# Patient Record
Sex: Male | Born: 1985 | Race: White | Hispanic: No
Health system: Southern US, Community
[De-identification: ages and names within clinical notes are randomized; demographics above are authoritative.]

---

## 2007-05-14 ENCOUNTER — Emergency Department (HOSPITAL_COMMUNITY): Admission: EM | Admit: 2007-05-14 | Discharge: 2007-05-14 | Payer: Self-pay | Admitting: Emergency Medicine

## 2007-08-17 ENCOUNTER — Emergency Department: Payer: Self-pay | Admitting: Emergency Medicine

## 2008-01-01 ENCOUNTER — Emergency Department: Payer: Self-pay | Admitting: Emergency Medicine

## 2008-05-10 ENCOUNTER — Emergency Department: Payer: Self-pay | Admitting: Emergency Medicine

## 2008-07-28 ENCOUNTER — Emergency Department: Payer: Self-pay | Admitting: Emergency Medicine

## 2008-11-19 ENCOUNTER — Emergency Department: Payer: Self-pay | Admitting: Emergency Medicine

## 2009-03-28 ENCOUNTER — Emergency Department: Payer: Self-pay | Admitting: Emergency Medicine

## 2009-06-27 ENCOUNTER — Emergency Department: Payer: Self-pay | Admitting: Emergency Medicine

## 2013-03-27 ENCOUNTER — Emergency Department: Payer: Self-pay | Admitting: Emergency Medicine

## 2013-03-27 LAB — URINALYSIS, COMPLETE
Bacteria: NONE SEEN
Bilirubin,UR: NEGATIVE
Blood: NEGATIVE
Glucose,UR: NEGATIVE mg/dL (ref 0–75)
LEUKOCYTE ESTERASE: NEGATIVE
Nitrite: NEGATIVE
Ph: 5 (ref 4.5–8.0)
Protein: 30
SPECIFIC GRAVITY: 1.031 (ref 1.003–1.030)
Squamous Epithelial: NONE SEEN
WBC UR: <1 /HPF (ref 0–5)

## 2013-03-27 LAB — COMPREHENSIVE METABOLIC PANEL
Albumin: 4.2 g/dL (ref 3.4–5.0)
Alkaline Phosphatase: 115 U/L
Anion Gap: 2 — ABNORMAL LOW (ref 7–16)
BUN: 13 mg/dL (ref 7–18)
Bilirubin,Total: 2 mg/dL — ABNORMAL HIGH (ref 0.2–1.0)
CALCIUM: 9.4 mg/dL (ref 8.5–10.1)
CHLORIDE: 97 mmol/L — AB (ref 98–107)
CO2: 31 mmol/L (ref 21–32)
CREATININE: 1.07 mg/dL (ref 0.60–1.30)
EGFR (African American): >60
EGFR (Non-African Amer.): >60
GLUCOSE: 108 mg/dL — AB (ref 65–99)
OSMOLALITY: 261 (ref 275–301)
POTASSIUM: 4 mmol/L (ref 3.5–5.1)
SGOT(AST): 28 U/L (ref 15–37)
SGPT (ALT): 22 U/L (ref 12–78)
Sodium: 130 mmol/L — ABNORMAL LOW (ref 136–145)
Total Protein: 8.5 g/dL — ABNORMAL HIGH (ref 6.4–8.2)

## 2013-03-27 LAB — CBC WITH DIFFERENTIAL/PLATELET
BASOS ABS: 0.1 10*3/uL (ref 0.0–0.1)
Basophil %: 0.5 %
EOS ABS: 0 10*3/uL (ref 0.0–0.7)
EOS PCT: 0 %
HCT: 44.3 % (ref 40.0–52.0)
HGB: 15.4 g/dL (ref 13.0–18.0)
LYMPHS ABS: 0.9 10*3/uL — AB (ref 1.0–3.6)
Lymphocyte %: 6.9 %
MCH: 30.7 pg (ref 26.0–34.0)
MCHC: 34.7 g/dL (ref 32.0–36.0)
MCV: 89 fL (ref 80–100)
MONO ABS: 1 x10 3/mm (ref 0.2–1.0)
MONOS PCT: 7.6 %
Neutrophil #: 10.7 10*3/uL — ABNORMAL HIGH (ref 1.4–6.5)
Neutrophil %: 85 %
PLATELETS: 343 10*3/uL (ref 150–440)
RBC: 4.99 10*6/uL (ref 4.40–5.90)
RDW: 12.1 % (ref 11.5–14.5)
WBC: 12.6 10*3/uL — ABNORMAL HIGH (ref 3.8–10.6)

## 2013-03-27 LAB — LIPASE, BLOOD: LIPASE: 96 U/L (ref 73–393)

## 2013-03-27 LAB — RAPID INFLUENZA A&B ANTIGENS

## 2017-04-02 ENCOUNTER — Emergency Department (HOSPITAL_COMMUNITY): Payer: Self-pay

## 2017-04-02 ENCOUNTER — Encounter (HOSPITAL_COMMUNITY): Payer: Self-pay | Admitting: Nurse Practitioner

## 2017-04-02 ENCOUNTER — Inpatient Hospital Stay (HOSPITAL_COMMUNITY): Payer: Self-pay

## 2017-04-02 ENCOUNTER — Inpatient Hospital Stay (HOSPITAL_COMMUNITY)
Admission: EM | Admit: 2017-04-02 | Discharge: 2017-04-07 | DRG: 604 | Disposition: A | Discharge status: Home / Self Care | Payer: Self-pay | Attending: Surgery | Admitting: Surgery

## 2017-04-02 DIAGNOSIS — R7989 Other specified abnormal findings of blood chemistry: Secondary | ICD-10-CM

## 2017-04-02 DIAGNOSIS — K029 Dental caries, unspecified: Secondary | ICD-10-CM | POA: Diagnosis present

## 2017-04-02 DIAGNOSIS — S01431A Puncture wound without foreign body of right cheek and temporomandibular area, initial encounter: Principal | ICD-10-CM | POA: Diagnosis present

## 2017-04-02 DIAGNOSIS — R402252 Coma scale, best verbal response, oriented, at arrival to emergency department: Secondary | ICD-10-CM | POA: Diagnosis present

## 2017-04-02 DIAGNOSIS — R509 Fever, unspecified: Secondary | ICD-10-CM | POA: Diagnosis not present

## 2017-04-02 DIAGNOSIS — S0451XA Injury of facial nerve, right side, initial encounter: Secondary | ICD-10-CM | POA: Diagnosis present

## 2017-04-02 DIAGNOSIS — W3400XA Accidental discharge from unspecified firearms or gun, initial encounter: Secondary | ICD-10-CM

## 2017-04-02 DIAGNOSIS — Z0189 Encounter for other specified special examinations: Secondary | ICD-10-CM

## 2017-04-02 DIAGNOSIS — F10120 Alcohol abuse with intoxication, uncomplicated: Secondary | ICD-10-CM | POA: Diagnosis present

## 2017-04-02 DIAGNOSIS — D62 Acute posthemorrhagic anemia: Secondary | ICD-10-CM | POA: Diagnosis present

## 2017-04-02 DIAGNOSIS — S0183XA Puncture wound without foreign body of other part of head, initial encounter: Secondary | ICD-10-CM

## 2017-04-02 DIAGNOSIS — Z23 Encounter for immunization: Secondary | ICD-10-CM

## 2017-04-02 DIAGNOSIS — R402362 Coma scale, best motor response, obeys commands, at arrival to emergency department: Secondary | ICD-10-CM | POA: Diagnosis present

## 2017-04-02 DIAGNOSIS — R402132 Coma scale, eyes open, to sound, at arrival to emergency department: Secondary | ICD-10-CM | POA: Diagnosis present

## 2017-04-02 DIAGNOSIS — J9601 Acute respiratory failure with hypoxia: Secondary | ICD-10-CM | POA: Diagnosis present

## 2017-04-02 DIAGNOSIS — F10920 Alcohol use, unspecified with intoxication, uncomplicated: Secondary | ICD-10-CM

## 2017-04-02 LAB — COMPREHENSIVE METABOLIC PANEL
ALBUMIN: 4.4 g/dL (ref 3.5–5.0)
ALK PHOS: 95 U/L (ref 38–126)
ALT: 32 U/L (ref 17–63)
AST: 45 U/L — ABNORMAL HIGH (ref 15–41)
Anion gap: 12 (ref 5–15)
BUN: <5 mg/dL — ABNORMAL LOW (ref 6–20)
CALCIUM: 8.7 mg/dL — AB (ref 8.9–10.3)
CO2: 24 mmol/L (ref 22–32)
Chloride: 106 mmol/L (ref 101–111)
Creatinine, Ser: 0.87 mg/dL (ref 0.61–1.24)
GFR calc non Af Amer: 50 mL/min — ABNORMAL LOW (ref >60)
GFR, EST AFRICAN AMERICAN: 58 mL/min — AB (ref >60)
GLUCOSE: 127 mg/dL — AB (ref 65–99)
Potassium: 3.3 mmol/L — ABNORMAL LOW (ref 3.5–5.1)
SODIUM: 142 mmol/L (ref 135–145)
Total Bilirubin: 0.6 mg/dL (ref 0.3–1.2)
Total Protein: 7.6 g/dL (ref 6.5–8.1)

## 2017-04-02 LAB — BPAM FFP
BLOOD PRODUCT EXPIRATION DATE: 201901252359
Blood Product Expiration Date: 201901272359
ISSUE DATE / TIME: 201901080033
ISSUE DATE / TIME: 201901080033
UNIT TYPE AND RH: 6200
Unit Type and Rh: 6200

## 2017-04-02 LAB — URINALYSIS, ROUTINE W REFLEX MICROSCOPIC
Bilirubin Urine: NEGATIVE
GLUCOSE, UA: NEGATIVE mg/dL
HGB URINE DIPSTICK: NEGATIVE
KETONES UR: NEGATIVE mg/dL
Leukocytes, UA: NEGATIVE
Nitrite: NEGATIVE
PROTEIN: NEGATIVE mg/dL
Specific Gravity, Urine: 1.035 — ABNORMAL HIGH (ref 1.005–1.030)
pH: 5 (ref 5.0–8.0)

## 2017-04-02 LAB — RAPID URINE DRUG SCREEN, HOSP PERFORMED
Amphetamines: NOT DETECTED
BARBITURATES: NOT DETECTED
BENZODIAZEPINES: NOT DETECTED
Cocaine: NOT DETECTED
Opiates: NOT DETECTED
Tetrahydrocannabinol: NOT DETECTED

## 2017-04-02 LAB — CK TOTAL AND CKMB (NOT AT ARMC)
CK, MB: 26.2 ng/mL — AB (ref 0.5–5.0)
RELATIVE INDEX: 4.3 — AB (ref 0.0–2.5)
Total CK: 605 U/L — ABNORMAL HIGH (ref 49–397)

## 2017-04-02 LAB — BPAM RBC
BLOOD PRODUCT EXPIRATION DATE: 201901222359
BLOOD PRODUCT EXPIRATION DATE: 201901222359
ISSUE DATE / TIME: 201901080032
ISSUE DATE / TIME: 201901080032
UNIT TYPE AND RH: 9500
UNIT TYPE AND RH: 9500

## 2017-04-02 LAB — CBC
HCT: 43.3 % (ref 39.0–52.0)
HCT: 38.1 % — ABNORMAL LOW (ref 39.0–52.0)
HEMATOCRIT: 26.4 % — AB (ref 39.0–52.0)
Hemoglobin: 15 g/dL (ref 13.0–17.0)
Hemoglobin: 12.5 g/dL — ABNORMAL LOW (ref 13.0–17.0)
Hemoglobin: 8.7 g/dL — ABNORMAL LOW (ref 13.0–17.0)
MCH: 30.2 pg (ref 26.0–34.0)
MCH: 30.5 pg (ref 26.0–34.0)
MCH: 31.9 pg (ref 26.0–34.0)
MCHC: 32.8 g/dL (ref 30.0–36.0)
MCHC: 33 g/dL (ref 30.0–36.0)
MCHC: 34.6 g/dL (ref 30.0–36.0)
MCV: 92.1 fL (ref 78.0–100.0)
MCV: 92.9 fL (ref 78.0–100.0)
MCV: 91.7 fL (ref 78.0–100.0)
PLATELETS: 224 10*3/uL (ref 150–400)
PLATELETS: 349 10*3/uL (ref 150–400)
PLATELETS: 386 10*3/uL (ref 150–400)
RBC: 4.7 MIL/uL (ref 4.22–5.81)
RBC: 4.1 MIL/uL — AB (ref 4.22–5.81)
RBC: 2.88 MIL/uL — ABNORMAL LOW (ref 4.22–5.81)
RDW: 12.7 % (ref 11.5–15.5)
RDW: 12.5 % (ref 11.5–15.5)
RDW: 12.7 % (ref 11.5–15.5)
WBC: 13.5 10*3/uL — ABNORMAL HIGH (ref 4.0–10.5)
WBC: 15.5 10*3/uL — ABNORMAL HIGH (ref 4.0–10.5)
WBC: 13.1 10*3/uL — ABNORMAL HIGH (ref 4.0–10.5)

## 2017-04-02 LAB — PREPARE FRESH FROZEN PLASMA
UNIT DIVISION: 0
Unit division: 0

## 2017-04-02 LAB — TYPE AND SCREEN
ABO/RH(D): O POS
ANTIBODY SCREEN: NEGATIVE
UNIT DIVISION: 0
Unit division: 0

## 2017-04-02 LAB — BASIC METABOLIC PANEL
Anion gap: 18 — ABNORMAL HIGH (ref 5–15)
BUN: 6 mg/dL (ref 6–20)
CALCIUM: 8 mg/dL — AB (ref 8.9–10.3)
CHLORIDE: 111 mmol/L (ref 101–111)
CO2: 13 mmol/L — AB (ref 22–32)
Creatinine, Ser: 0.72 mg/dL (ref 0.61–1.24)
GFR calc non Af Amer: >60 mL/min (ref >60)
Glucose, Bld: 95 mg/dL (ref 65–99)
Potassium: 3.6 mmol/L (ref 3.5–5.1)
Sodium: 142 mmol/L (ref 135–145)

## 2017-04-02 LAB — DIFFERENTIAL
BASOS PCT: 0 %
Basophils Absolute: 0 10*3/uL (ref 0.0–0.1)
EOS ABS: 0 10*3/uL (ref 0.0–0.7)
EOS PCT: 0 %
LYMPHS ABS: 2.2 10*3/uL (ref 0.7–4.0)
Lymphocytes Relative: 15 %
Monocytes Absolute: 0.6 10*3/uL (ref 0.1–1.0)
Monocytes Relative: 4 %
NEUTROS PCT: 81 %
Neutro Abs: 11.5 10*3/uL — ABNORMAL HIGH (ref 1.7–7.7)

## 2017-04-02 LAB — TROPONIN I

## 2017-04-02 LAB — I-STAT CHEM 8, ED
CHLORIDE: 106 mmol/L (ref 101–111)
Calcium, Ion: 1.01 mmol/L — ABNORMAL LOW (ref 1.15–1.40)
Creatinine, Ser: 1.2 mg/dL (ref 0.61–1.24)
GLUCOSE: 125 mg/dL — AB (ref 65–99)
HCT: 44 % (ref 39.0–52.0)
Hemoglobin: 15 g/dL (ref 13.0–17.0)
POTASSIUM: 3.3 mmol/L — AB (ref 3.5–5.1)
Sodium: 146 mmol/L — ABNORMAL HIGH (ref 135–145)
TCO2: 25 mmol/L (ref 22–32)

## 2017-04-02 LAB — PROTIME-INR
INR: 1.04
Prothrombin Time: 13.5 seconds (ref 11.4–15.2)

## 2017-04-02 LAB — MRSA PCR SCREENING: MRSA by PCR: NEGATIVE

## 2017-04-02 LAB — CDS SEROLOGY

## 2017-04-02 LAB — TRIGLYCERIDES: Triglycerides: 111 mg/dL (ref <150)

## 2017-04-02 LAB — I-STAT CG4 LACTIC ACID, ED: Lactic Acid, Venous: 3.1 mmol/L (ref 0.5–1.9)

## 2017-04-02 LAB — ABO/RH: ABO/RH(D): O POS

## 2017-04-02 LAB — ETHANOL: ALCOHOL ETHYL (B): 300 mg/dL — AB (ref <10)

## 2017-04-02 LAB — APTT: aPTT: 31 seconds (ref 24–36)

## 2017-04-02 LAB — BLOOD PRODUCT ORDER (VERBAL) VERIFICATION

## 2017-04-02 MED ORDER — ORAL CARE MOUTH RINSE: 15.0000 mL | Freq: Four times a day (QID) | OROMUCOSAL | Status: DC

## 2017-04-02 MED ORDER — ALBUMIN HUMAN 5 % IV SOLN
25.0000 g | Freq: Once | INTRAVENOUS | Status: AC
  Administered 2017-04-02: 25 g via INTRAVENOUS
  Filled 2017-04-02: qty 250

## 2017-04-02 MED ORDER — ENOXAPARIN SODIUM 40 MG/0.4ML ~~LOC~~ SOLN
40.0000 mg | SUBCUTANEOUS | Status: DC
  Administered 2017-04-02 – 2017-04-07 (×6): 40 mg via SUBCUTANEOUS
  Filled 2017-04-02 (×6): qty 0.4

## 2017-04-02 MED ORDER — FENTANYL 2500MCG IN NS 250ML (10MCG/ML) PREMIX INFUSION
25.0000 ug/h | INTRAVENOUS | Status: DC
  Administered 2017-04-02: 150 ug/h via INTRAVENOUS
  Administered 2017-04-02: 50 ug/h via INTRAVENOUS
  Administered 2017-04-03: 25 ug/h via INTRAVENOUS
  Filled 2017-04-02 (×2): qty 250

## 2017-04-02 MED ORDER — FENTANYL CITRATE (PF) 100 MCG/2ML IJ SOLN
INTRAMUSCULAR | Status: AC | PRN
  Administered 2017-04-02: 100 ug via INTRAVENOUS

## 2017-04-02 MED ORDER — CHLORHEXIDINE GLUCONATE 0.12% ORAL RINSE (MEDLINE KIT)
15.0000 mL | Freq: Two times a day (BID) | OROMUCOSAL | Status: DC
  Administered 2017-04-02 – 2017-04-07 (×11): 15 mL via OROMUCOSAL

## 2017-04-02 MED ORDER — PANTOPRAZOLE SODIUM 40 MG PO TBEC
40.0000 mg | DELAYED_RELEASE_TABLET | Freq: Every day | ORAL | Status: DC
  Administered 2017-04-07: 40 mg via ORAL
  Filled 2017-04-02 (×2): qty 1

## 2017-04-02 MED ORDER — ADULT MULTIVITAMIN W/MINERALS CH
1.0000 | ORAL_TABLET | Freq: Every day | ORAL | Status: DC
  Administered 2017-04-02 – 2017-04-07 (×5): 1 via ORAL
  Filled 2017-04-02 (×5): qty 1

## 2017-04-02 MED ORDER — SUCCINYLCHOLINE CHLORIDE 20 MG/ML IJ SOLN
INTRAMUSCULAR | Status: AC | PRN
  Administered 2017-04-02: 100 mg via INTRAVENOUS

## 2017-04-02 MED ORDER — FENTANYL CITRATE (PF) 100 MCG/2ML IJ SOLN
INTRAMUSCULAR | Status: AC
  Filled 2017-04-02: qty 2

## 2017-04-02 MED ORDER — ONDANSETRON HCL 4 MG/2ML IJ SOLN
4.0000 mg | Freq: Four times a day (QID) | INTRAMUSCULAR | Status: DC | PRN
  Administered 2017-04-02: 4 mg via INTRAVENOUS
  Filled 2017-04-02: qty 2

## 2017-04-02 MED ORDER — DEXMEDETOMIDINE HCL IN NACL 200 MCG/50ML IV SOLN
0.4000 ug/kg/h | INTRAVENOUS | Status: DC
  Administered 2017-04-02: 0.4 ug/kg/h via INTRAVENOUS
  Filled 2017-04-02 (×2): qty 50

## 2017-04-02 MED ORDER — PROPOFOL 1000 MG/100ML IV EMUL
INTRAVENOUS | Status: AC
  Filled 2017-04-02: qty 100

## 2017-04-02 MED ORDER — LORAZEPAM 1 MG PO TABS: 1.0000 mg | ORAL_TABLET | Freq: Four times a day (QID) | ORAL | Status: AC | PRN

## 2017-04-02 MED ORDER — PANTOPRAZOLE SODIUM 40 MG IV SOLR
40.0000 mg | Freq: Every day | INTRAVENOUS | Status: DC
  Administered 2017-04-02 – 2017-04-06 (×5): 40 mg via INTRAVENOUS
  Filled 2017-04-02 (×5): qty 40

## 2017-04-02 MED ORDER — CEFAZOLIN SODIUM-DEXTROSE 1-4 GM/50ML-% IV SOLN
1.0000 g | Freq: Once | INTRAVENOUS | Status: AC
  Administered 2017-04-02: 1 g via INTRAVENOUS
  Filled 2017-04-02: qty 50

## 2017-04-02 MED ORDER — PROPOFOL 10 MG/ML IV BOLUS
INTRAVENOUS | Status: AC | PRN
  Administered 2017-04-02: 20 ug via INTRAVENOUS

## 2017-04-02 MED ORDER — PROPOFOL 1000 MG/100ML IV EMUL
5.0000 ug/kg/min | Freq: Once | INTRAVENOUS | Status: DC
  Administered 2017-04-02: 20 ug/kg/min via INTRAVENOUS

## 2017-04-02 MED ORDER — ORAL CARE MOUTH RINSE
15.0000 mL | OROMUCOSAL | Status: DC
  Administered 2017-04-02 – 2017-04-07 (×45): 15 mL via OROMUCOSAL

## 2017-04-02 MED ORDER — FOLIC ACID 1 MG PO TABS
1.0000 mg | ORAL_TABLET | Freq: Every day | ORAL | Status: DC
  Administered 2017-04-02 – 2017-04-07 (×5): 1 mg via ORAL
  Filled 2017-04-02 (×5): qty 1

## 2017-04-02 MED ORDER — POTASSIUM CHLORIDE IN NACL 20-0.9 MEQ/L-% IV SOLN
INTRAVENOUS | Status: DC
  Administered 2017-04-02 – 2017-04-06 (×7): via INTRAVENOUS
  Filled 2017-04-02 (×11): qty 1000

## 2017-04-02 MED ORDER — SODIUM CHLORIDE 0.45 % IV BOLUS: 1000.0000 mL | Freq: Once | INTRAVENOUS | Status: DC

## 2017-04-02 MED ORDER — CHLORHEXIDINE GLUCONATE 0.12% ORAL RINSE (MEDLINE KIT): 15.0000 mL | Freq: Two times a day (BID) | OROMUCOSAL | Status: DC

## 2017-04-02 MED ORDER — SODIUM CHLORIDE 0.9 % IV BOLUS (SEPSIS)
1000.0000 mL | Freq: Once | INTRAVENOUS | Status: AC
  Administered 2017-04-02: 1000 mL via INTRAVENOUS

## 2017-04-02 MED ORDER — DOCUSATE SODIUM 100 MG PO CAPS
100.0000 mg | ORAL_CAPSULE | Freq: Two times a day (BID) | ORAL | Status: DC
  Administered 2017-04-05 – 2017-04-07 (×5): 100 mg via ORAL
  Filled 2017-04-02 (×6): qty 1

## 2017-04-02 MED ORDER — FENTANYL CITRATE (PF) 100 MCG/2ML IJ SOLN
INTRAMUSCULAR | Status: AC | PRN
  Administered 2017-04-02: 100 ug via INTRAVENOUS
  Administered 2017-04-02: 50 ug via INTRAVENOUS

## 2017-04-02 MED ORDER — TETANUS-DIPHTH-ACELL PERTUSSIS 5-2.5-18.5 LF-MCG/0.5 IM SUSP
0.5000 mL | Freq: Once | INTRAMUSCULAR | Status: AC
  Administered 2017-04-02: 0.5 mL via INTRAMUSCULAR
  Filled 2017-04-02: qty 0.5

## 2017-04-02 MED ORDER — THIAMINE HCL 100 MG/ML IJ SOLN
100.0000 mg | Freq: Every day | INTRAMUSCULAR | Status: DC
  Administered 2017-04-02 – 2017-04-06 (×4): 100 mg via INTRAVENOUS
  Filled 2017-04-02 (×4): qty 2

## 2017-04-02 MED ORDER — PROPOFOL 1000 MG/100ML IV EMUL
0.0000 ug/kg/min | INTRAVENOUS | Status: DC
  Administered 2017-04-02: 40 ug/kg/min via INTRAVENOUS
  Filled 2017-04-02: qty 100

## 2017-04-02 MED ORDER — ETOMIDATE 2 MG/ML IV SOLN
INTRAVENOUS | Status: AC | PRN
  Administered 2017-04-02: 20 mg via INTRAVENOUS

## 2017-04-02 MED ORDER — FENTANYL CITRATE (PF) 100 MCG/2ML IJ SOLN: 50.0000 ug | Freq: Once | INTRAMUSCULAR | Status: DC

## 2017-04-02 MED ORDER — FENTANYL BOLUS VIA INFUSION
50.0000 ug | INTRAVENOUS | Status: DC | PRN
  Filled 2017-04-02: qty 50

## 2017-04-02 MED ORDER — VITAMIN B-1 100 MG PO TABS
100.0000 mg | ORAL_TABLET | Freq: Every day | ORAL | Status: DC
  Administered 2017-04-05 – 2017-04-07 (×2): 100 mg via ORAL
  Filled 2017-04-02 (×2): qty 1

## 2017-04-02 MED ORDER — IOPAMIDOL (ISOVUE-370) INJECTION 76%
INTRAVENOUS | Status: AC
  Administered 2017-04-02: 50 mL
  Filled 2017-04-02: qty 50

## 2017-04-02 MED ORDER — ONDANSETRON 4 MG PO TBDP
4.0000 mg | ORAL_TABLET | Freq: Four times a day (QID) | ORAL | Status: DC | PRN
Start: 2017-04-02 — End: 2017-04-07

## 2017-04-02 MED ORDER — ACETAMINOPHEN 160 MG/5ML PO SOLN
650.0000 mg | Freq: Four times a day (QID) | ORAL | Status: DC | PRN
Start: 2017-04-02 — End: 2017-04-06
  Administered 2017-04-02: 650 mg
  Filled 2017-04-02: qty 20.3

## 2017-04-02 MED ORDER — IBUPROFEN 100 MG/5ML PO SUSP
400.0000 mg | Freq: Three times a day (TID) | ORAL | Status: DC | PRN
  Administered 2017-04-02 – 2017-04-03 (×2): 400 mg
  Filled 2017-04-02 (×3): qty 20

## 2017-04-02 MED ORDER — NOREPINEPHRINE BITARTRATE 1 MG/ML IV SOLN
0.0000 ug/min | INTRAVENOUS | Status: DC
  Filled 2017-04-02: qty 4

## 2017-04-02 MED ORDER — CEFAZOLIN SODIUM-DEXTROSE 1-4 GM/50ML-% IV SOLN
1.0000 g | Freq: Three times a day (TID) | INTRAVENOUS | Status: DC
  Administered 2017-04-02 – 2017-04-07 (×15): 1 g via INTRAVENOUS
  Filled 2017-04-02 (×22): qty 50

## 2017-04-02 MED ORDER — LORAZEPAM 2 MG/ML IJ SOLN: 1.0000 mg | Freq: Four times a day (QID) | INTRAMUSCULAR | Status: AC | PRN
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
## 2017-04-02 NOTE — Progress Notes (Signed)
Patient ID: Joe Fischer, male   DOB: 29-Jun-1985, 32 y.o.   MRN: [id number] Intubated and sedated.  No significant change in exam.  Packing in place.  No obvious infection.  Sister and father in the room today, discussed the plan with them.  Plan is to bring him to the operating room tomorrow for wound exploration, attempted repair of parotid duct if possible, facial nerve if possible, and skin defect.

## 2017-04-02 NOTE — Progress Notes (Signed)
RT note-E-Tab changed
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
## 2017-04-02 NOTE — H&P (View-Only) (Signed)
Patient ID: Joe Fischer, male   DOB: 1986-03-06, 32 y.o.   MRN: [id number] Intubated and sedated.  No significant change in exam.  Packing in place.  No obvious infection.  Sister and father in the room today, discussed the plan with them.  Plan is to bring him to the operating room tomorrow for wound exploration, attempted repair of parotid duct if possible, facial nerve if possible, and skin defect.
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
## 2017-04-02 NOTE — ED Notes (Signed)
Pt transported to CT with Dr. Daria Pastures RN and RT
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
## 2017-04-03 ENCOUNTER — Encounter (HOSPITAL_COMMUNITY): Admission: EM | Disposition: A | Discharge status: Home / Self Care | Payer: Self-pay | Source: Home / Self Care

## 2017-04-03 ENCOUNTER — Inpatient Hospital Stay (HOSPITAL_COMMUNITY): Payer: Self-pay | Admitting: Certified Registered"

## 2017-04-03 HISTORY — PX: COMPLEX WOUND CLOSURE: SHX6446

## 2017-04-03 HISTORY — PX: WOUND EXPLORATION: SHX6188

## 2017-04-03 HISTORY — PX: DEBRIDEMENT AND CLOSURE WOUND: SHX5614

## 2017-04-03 LAB — BASIC METABOLIC PANEL
ANION GAP: 8 (ref 5–15)
BUN: 12 mg/dL (ref 6–20)
CHLORIDE: 114 mmol/L — AB (ref 101–111)
CO2: 21 mmol/L — ABNORMAL LOW (ref 22–32)
Calcium: 8.3 mg/dL — ABNORMAL LOW (ref 8.9–10.3)
Creatinine, Ser: 0.93 mg/dL (ref 0.61–1.24)
GFR calc non Af Amer: >60 mL/min (ref >60)
Glucose, Bld: 101 mg/dL — ABNORMAL HIGH (ref 65–99)
POTASSIUM: 4.2 mmol/L (ref 3.5–5.1)
SODIUM: 143 mmol/L (ref 135–145)

## 2017-04-03 LAB — SURGICAL PCR SCREEN
MRSA, PCR: NEGATIVE
STAPHYLOCOCCUS AUREUS: NEGATIVE

## 2017-04-03 LAB — CBC
HCT: 25.3 % — ABNORMAL LOW (ref 39.0–52.0)
HEMOGLOBIN: 8.3 g/dL — AB (ref 13.0–17.0)
MCH: 30.4 pg (ref 26.0–34.0)
MCHC: 32.8 g/dL (ref 30.0–36.0)
MCV: 92.7 fL (ref 78.0–100.0)
Platelets: 208 10*3/uL (ref 150–400)
RBC: 2.73 MIL/uL — AB (ref 4.22–5.81)
RDW: 12.9 % (ref 11.5–15.5)
WBC: 9.3 10*3/uL (ref 4.0–10.5)

## 2017-04-03 SURGERY — WOUND EXPLORATION
Anesthesia: General | Site: Mouth | Laterality: Right

## 2017-04-03 MED ORDER — MIDAZOLAM HCL 5 MG/5ML IJ SOLN
INTRAMUSCULAR | Status: DC | PRN
  Administered 2017-04-03: 2 mg via INTRAVENOUS

## 2017-04-03 MED ORDER — MIDAZOLAM HCL 2 MG/2ML IJ SOLN
INTRAMUSCULAR | Status: AC
  Filled 2017-04-03: qty 2

## 2017-04-03 MED ORDER — LACTATED RINGERS IV SOLN
INTRAVENOUS | Status: DC | PRN
  Administered 2017-04-03: 13:00:00 via INTRAVENOUS

## 2017-04-03 MED ORDER — DEXAMETHASONE SODIUM PHOSPHATE 10 MG/ML IJ SOLN
INTRAMUSCULAR | Status: AC
  Filled 2017-04-03: qty 1

## 2017-04-03 MED ORDER — FENTANYL CITRATE (PF) 100 MCG/2ML IJ SOLN
INTRAMUSCULAR | Status: DC | PRN
  Administered 2017-04-03: 50 ug via INTRAVENOUS
  Administered 2017-04-03: 100 ug via INTRAVENOUS
  Administered 2017-04-03: 250 ug via INTRAVENOUS
  Administered 2017-04-03: 100 ug via INTRAVENOUS

## 2017-04-03 MED ORDER — DEXAMETHASONE SODIUM PHOSPHATE 4 MG/ML IJ SOLN
INTRAMUSCULAR | Status: DC | PRN
  Administered 2017-04-03: 10 mg via INTRAVENOUS

## 2017-04-03 MED ORDER — ONDANSETRON HCL 4 MG/2ML IJ SOLN
INTRAMUSCULAR | Status: DC | PRN
  Administered 2017-04-03: 4 mg via INTRAVENOUS

## 2017-04-03 MED ORDER — ARTIFICIAL TEARS OPHTHALMIC OINT
TOPICAL_OINTMENT | OPHTHALMIC | Status: DC | PRN
  Administered 2017-04-03: 1 via OPHTHALMIC

## 2017-04-03 MED ORDER — FENTANYL CITRATE (PF) 250 MCG/5ML IJ SOLN
INTRAMUSCULAR | Status: AC
  Filled 2017-04-03: qty 5

## 2017-04-03 MED ORDER — BACITRACIN ZINC 500 UNIT/GM EX OINT
TOPICAL_OINTMENT | CUTANEOUS | Status: AC
  Filled 2017-04-03: qty 28.35

## 2017-04-03 MED ORDER — PROPOFOL 10 MG/ML IV BOLUS
INTRAVENOUS | Status: AC
  Filled 2017-04-03: qty 20

## 2017-04-03 MED ORDER — 0.9 % SODIUM CHLORIDE (POUR BTL) OPTIME
TOPICAL | Status: DC | PRN
  Administered 2017-04-03: 1000 mL

## 2017-04-03 MED ORDER — PROPOFOL 10 MG/ML IV BOLUS
INTRAVENOUS | Status: DC | PRN
  Administered 2017-04-03: 70 mg via INTRAVENOUS

## 2017-04-03 MED ORDER — ONDANSETRON HCL 4 MG/2ML IJ SOLN
INTRAMUSCULAR | Status: AC
  Filled 2017-04-03: qty 2

## 2017-04-03 SURGICAL SUPPLY — 55 items
APPLIER CLIP 9.375 SM OPEN (CLIP)
BLADE SURG 15 STRL LF DISP TIS (BLADE) IMPLANT
BLADE SURG 15 STRL SS (BLADE)
CANISTER SUCT 3000ML PPV (MISCELLANEOUS) ×5 IMPLANT
CLEANER TIP ELECTROSURG 2X2 (MISCELLANEOUS) ×5 IMPLANT
CLIP APPLIE 9.375 SM OPEN (CLIP) IMPLANT
CONT SPEC 4OZ CLIKSEAL STRL BL (MISCELLANEOUS) IMPLANT
CORDS BIPOLAR (ELECTRODE) ×5 IMPLANT
COVER SURGICAL LIGHT HANDLE (MISCELLANEOUS) ×5 IMPLANT
CRADLE DONUT ADULT HEAD (MISCELLANEOUS) ×5 IMPLANT
DRAIN CHANNEL 15F RND FF W/TCR (WOUND CARE) IMPLANT
DRAIN PENROSE 1/4X12 LTX STRL (WOUND CARE) ×5 IMPLANT
DRAIN SNY 10 ROU (WOUND CARE) IMPLANT
DRAPE HALF SHEET 40X57 (DRAPES) IMPLANT
DRAPE INCISE 23X17 IOBAN STRL (DRAPES)
DRAPE INCISE IOBAN 23X17 STRL (DRAPES) IMPLANT
DRAPE MICROSCOPE LEICA 54X105 (DRAPE) ×5 IMPLANT
ELECT COATED BLADE 2.86 ST (ELECTRODE) ×5 IMPLANT
ELECT REM PT RETURN 9FT ADLT (ELECTROSURGICAL) ×5
ELECTRODE REM PT RTRN 9FT ADLT (ELECTROSURGICAL) ×3 IMPLANT
EVACUATOR SILICONE 100CC (DRAIN) IMPLANT
FORCEPS BIPOLAR SPETZLER 8 1.0 (NEUROSURGERY SUPPLIES) IMPLANT
GAUZE SPONGE 4X4 12PLY STRL (GAUZE/BANDAGES/DRESSINGS) ×5 IMPLANT
GAUZE SPONGE 4X4 16PLY XRAY LF (GAUZE/BANDAGES/DRESSINGS) ×10 IMPLANT
GLOVE ECLIPSE 7.5 STRL STRAW (GLOVE) ×5 IMPLANT
GOWN STRL REUS W/ TWL LRG LVL3 (GOWN DISPOSABLE) ×6 IMPLANT
GOWN STRL REUS W/TWL LRG LVL3 (GOWN DISPOSABLE) ×4
KIT BASIN OR (CUSTOM PROCEDURE TRAY) ×5 IMPLANT
KIT ROOM TURNOVER OR (KITS) ×5 IMPLANT
LOCATOR NERVE 3 VOLT (DISPOSABLE) IMPLANT
NEEDLE PRECISIONGLIDE 27X1.5 (NEEDLE) IMPLANT
NS IRRIG 1000ML POUR BTL (IV SOLUTION) ×5 IMPLANT
PAD ARMBOARD 7.5X6 YLW CONV (MISCELLANEOUS) ×5 IMPLANT
PENCIL FOOT CONTROL (ELECTRODE) ×5 IMPLANT
SPECIMEN JAR MEDIUM (MISCELLANEOUS) IMPLANT
SPONGE INTESTINAL PEANUT (DISPOSABLE) ×5 IMPLANT
SPONGE LAP 18X18 X RAY DECT (DISPOSABLE) IMPLANT
STAPLER VISISTAT 35W (STAPLE) ×5 IMPLANT
SUT CHROMIC 3 0 SH 27 (SUTURE) ×10 IMPLANT
SUT CHROMIC 5 0 P 3 (SUTURE) IMPLANT
SUT ETHILON 3 0 PS 1 (SUTURE) IMPLANT
SUT ETHILON 5 0 PS 2 18 (SUTURE) IMPLANT
SUT PLAIN 3 0 X 1 18 (SUTURE) ×10 IMPLANT
SUT SILK 2 0 REEL (SUTURE) IMPLANT
SUT SILK 3 0 SH CR/8 (SUTURE) IMPLANT
SUT SILK 4 0 REEL (SUTURE) IMPLANT
SUT VIC AB 3-0 SH 18 (SUTURE) IMPLANT
SUT VIC AB 3-0 SH 27 (SUTURE) ×12
SUT VIC AB 3-0 SH 27X BRD (SUTURE) ×6 IMPLANT
SUT VIC AB 3-0 SH 27XBRD (SUTURE) ×12 IMPLANT
TOWEL OR 17X24 6PK STRL BLUE (TOWEL DISPOSABLE) ×5 IMPLANT
TRAY ENT MC OR (CUSTOM PROCEDURE TRAY) ×5 IMPLANT
TRAY FOLEY W/METER SILVER 14FR (SET/KITS/TRAYS/PACK) IMPLANT
TUBE FEEDING 10FR FLEXIFLO (MISCELLANEOUS) IMPLANT
WATER STERILE IRR 1000ML POUR (IV SOLUTION) IMPLANT

## 2017-04-03 NOTE — Anesthesia Postprocedure Evaluation (Signed)
Anesthesia Post Note  Patient: Joe Fischer  Procedure(s) Performed: RIGHT FACIAL WOUND EXPLORATION (Right Face) RIGHT FACIAL LACERATION CLOSURE (Right Face) CLOSURE RIGHT ORAL CAVITY WOUND (Right Mouth)     Patient location during evaluation: SICU Anesthesia Type: General Level of consciousness: sedated Pain management: pain level controlled Vital Signs Assessment: post-procedure vital signs reviewed and stable Respiratory status: patient remains intubated per anesthesia plan Cardiovascular status: stable Postop Assessment: no apparent nausea or vomiting Anesthetic complications: no    Last Vitals:  Vitals:   04/03/17 1700 04/03/17 1800  BP:    Pulse: 81 88  Resp: 11 16  Temp:    SpO2: 100% 100%    Last Pain:  Vitals:   04/03/17 1200  TempSrc: Axillary  PainSc:     LLE Motor Response: Purposeful movement (04/03/17 1800)   RLE Motor Response: Purposeful movement (04/03/17 1800)        Effie Berkshire

## 2017-04-03 NOTE — Progress Notes (Signed)
   04/03/17 1100  Clinical Encounter Type  Visited With Patient and family together  Visit Type Follow-up  Referral From Chaplain  Consult/Referral To Chaplain  Spiritual Encounters  Spiritual Needs Prayer  Stress Factors  Patient Stress Factors Major life changes  Family Stress Factors Health changes  Chaplain rounded and met with PT and Family.  The family was very empathetic and there was a sense of joy in the room as well.  The family is primarily [facility] and [place].  PT asked the family and Chaplain to pray with him.  Chaplain prayed and let the family know they have support when needed here in the hospital.
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
## 2017-04-03 NOTE — Anesthesia Preprocedure Evaluation (Signed)
Anesthesia Evaluation  Patient identified by MRN, date of birth, ID band Patient awake    Reviewed: Allergy & Precautions, NPO status , Patient's Chart, lab work & pertinent test results  Airway Mallampati: Intubated       Dental   Pulmonary    Pulmonary exam normal        Cardiovascular Normal cardiovascular exam     Neuro/Psych    GI/Hepatic   Endo/Other    Renal/GU      Musculoskeletal   Abdominal   Peds  Hematology   Anesthesia Other Findings   Reproductive/Obstetrics                             Anesthesia Physical Anesthesia Plan  ASA: II  Anesthesia Plan: General   Post-op Pain Management:    Induction: Intravenous  PONV Risk Score and Plan: 2 and Treatment may vary due to age or medical condition  Airway Management Planned: Oral ETT  Additional Equipment:   Intra-op Plan:   Post-operative Plan: Post-operative intubation/ventilation  Informed Consent: I have reviewed the patients History and Physical, chart, labs and discussed the procedure including the risks, benefits and alternatives for the proposed anesthesia with the patient or authorized representative who has indicated his/her understanding and acceptance.     Plan Discussed with: CRNA and Surgeon  Anesthesia Plan Comments:         Anesthesia Quick Evaluation

## 2017-04-03 NOTE — Transfer of Care (Signed)
Immediate Anesthesia Transfer of Care Note  Patient: Joe Fischer  Procedure(s) Performed: RIGHT FACIAL WOUND EXPLORATION (Right Face) RIGHT FACIAL LACERATION CLOSURE (Right Face) CLOSURE RIGHT ORAL CAVITY WOUND (Right Mouth)  Patient Location: ICU  Anesthesia Type:General  Level of Consciousness: sedated and Patient remains intubated per anesthesia plan  Airway & Oxygen Therapy: Patient remains intubated per anesthesia plan and Patient placed on Ventilator (see vital sign flow sheet for setting)  Post-op Assessment: Report given to RN and Post -op Vital signs reviewed and stable  Post vital signs: Reviewed and stable  Last Vitals:  Vitals:   04/03/17 1118 04/03/17 1200  BP:  140/73  Pulse:  85  Resp:  16  Temp:  37.7 C  SpO2: 100% 100%    Last Pain:  Vitals:   04/03/17 1200  TempSrc: Axillary  PainSc:          Complications: No apparent anesthesia complications
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
## 2017-04-03 NOTE — Progress Notes (Signed)
RT changed tape holding ETT and repositioned ETT to center of mouth. Patient tolerated well. RN is aware. RT will monitor as needed.
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
## 2017-04-04 ENCOUNTER — Encounter (HOSPITAL_COMMUNITY): Payer: Self-pay | Admitting: Otolaryngology

## 2017-04-04 LAB — CBC
HCT: 23.9 % — ABNORMAL LOW (ref 39.0–52.0)
Hemoglobin: 7.8 g/dL — ABNORMAL LOW (ref 13.0–17.0)
MCH: 30.4 pg (ref 26.0–34.0)
MCHC: 32.6 g/dL (ref 30.0–36.0)
MCV: 93 fL (ref 78.0–100.0)
Platelets: 253 10*3/uL (ref 150–400)
RBC: 2.57 MIL/uL — ABNORMAL LOW (ref 4.22–5.81)
RDW: 13.1 % (ref 11.5–15.5)
WBC: 11.1 10*3/uL — AB (ref 4.0–10.5)

## 2017-04-04 MED ORDER — FENTANYL CITRATE (PF) 100 MCG/2ML IJ SOLN
25.0000 ug | INTRAMUSCULAR | Status: DC | PRN
  Administered 2017-04-04 – 2017-04-05 (×14): 25 ug via INTRAVENOUS
  Filled 2017-04-04 (×14): qty 2

## 2017-04-04 MED ORDER — OXYCODONE HCL 5 MG PO TABS: 5.0000 mg | ORAL_TABLET | ORAL | Status: DC | PRN

## 2017-04-04 NOTE — Progress Notes (Signed)
Patient ID: Joe Fischer, male   DOB: [birth year], 32 y.o.   MRN: [id number] Postop day 1, extubated, awake and stable.  He is able to talk.  There is weakness of the entire lower division of the right facial nerve including the buccal branch which was found to be intact surgically.  The lacerations are holding together nicely.  The drain is in place.  There is a fair amount of drainage but nothing that appears to be saliva.  Recommend we keep him n.p.o. for 1 more day, total of 2 days.  Allow the buccal defect to heal completely.  Start liquids tomorrow.  Plan on removing the drain on Friday.

## 2017-04-04 NOTE — Progress Notes (Signed)
Patient ID: Joe Fischer, male   DOB: 02-24-1986, 32 y.o.   MRN: [id number] Doing well - TF to 4NP. I spoke with his father as well.  Georganna Skeans, MD, MPH, FACS Trauma: [phone] General Surgery: [phone]

## 2017-04-04 NOTE — Progress Notes (Signed)
200 cc Fentanyl wasted in sink. Blossom Hoops RN witnessed

## 2017-04-04 NOTE — Plan of Care (Signed)
Discussed pain management with pt.

## 2017-04-04 NOTE — Plan of Care (Signed)
Tube feeding restarted.
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
## 2017-04-05 LAB — BASIC METABOLIC PANEL
Anion gap: 11 (ref 5–15)
BUN: 7 mg/dL (ref 6–20)
CALCIUM: 8.2 mg/dL — AB (ref 8.9–10.3)
CO2: 20 mmol/L — ABNORMAL LOW (ref 22–32)
Chloride: 107 mmol/L (ref 101–111)
Creatinine, Ser: 0.8 mg/dL (ref 0.61–1.24)
GFR calc Af Amer: >60 mL/min (ref >60)
GLUCOSE: 81 mg/dL (ref 65–99)
Potassium: 3.5 mmol/L (ref 3.5–5.1)
Sodium: 138 mmol/L (ref 135–145)

## 2017-04-05 LAB — TRIGLYCERIDES: Triglycerides: 104 mg/dL (ref <150)

## 2017-04-05 LAB — CBC
HCT: 23.7 % — ABNORMAL LOW (ref 39.0–52.0)
Hemoglobin: 8 g/dL — ABNORMAL LOW (ref 13.0–17.0)
MCH: 31.5 pg (ref 26.0–34.0)
MCHC: 33.8 g/dL (ref 30.0–36.0)
MCV: 93.3 fL (ref 78.0–100.0)
PLATELETS: 363 10*3/uL (ref 150–400)
RBC: 2.54 MIL/uL — ABNORMAL LOW (ref 4.22–5.81)
RDW: 12.9 % (ref 11.5–15.5)
WBC: 7.5 10*3/uL (ref 4.0–10.5)

## 2017-04-05 MED ORDER — HYDROMORPHONE HCL 1 MG/ML IJ SOLN
0.5000 mg | INTRAMUSCULAR | Status: DC | PRN
  Administered 2017-04-05 – 2017-04-07 (×11): 0.5 mg via INTRAVENOUS
  Filled 2017-04-05 (×11): qty 0.5

## 2017-04-05 MED ORDER — IBUPROFEN 100 MG/5ML PO SUSP
400.0000 mg | Freq: Three times a day (TID) | ORAL | Status: DC | PRN
  Filled 2017-04-05: qty 20

## 2017-04-05 MED ORDER — HYDROCODONE-ACETAMINOPHEN 7.5-325 MG/15ML PO SOLN
10.0000 mL | ORAL | Status: DC | PRN
  Administered 2017-04-05 – 2017-04-07 (×9): 10 mL via ORAL
  Filled 2017-04-05 (×9): qty 15

## 2017-04-05 NOTE — Progress Notes (Signed)
   04/05/17 1100  Clinical Encounter Type  Visited With Patient  Visit Type Spiritual support  Referral From Chaplain  Consult/Referral To Chaplain  Spiritual Encounters  Spiritual Needs Prayer  Stress Factors  Patient Stress Factors Major life changes  Family Stress Factors Health changes  Chaplain was called to the room by nurse.  The PT wanted to request prayer, not just for himself but all of the PT's on the floor.  We talked about gratitude and living out this new lease on life to the fullest.

## 2017-04-05 NOTE — Evaluation (Signed)
Clinical/Bedside Swallow Evaluation Patient Details  Name: Joe Fischer MRN: [id number] Date of Birth: 05-Oct-1985  Today's Date: 04/05/2017 Time: SLP Start Time (ACUTE ONLY): 1120 SLP Stop Time (ACUTE ONLY): 1137 SLP Time Calculation (min) (ACUTE ONLY): 17 min  Past Medical History: History reviewed. No pertinent past medical history. Past Surgical History:  Past Surgical History:  Procedure Laterality Date  . COMPLEX WOUND CLOSURE Right 04/03/2017   Procedure: RIGHT FACIAL LACERATION CLOSURE;  Surgeon: Izora Gala, MD;  Location: [facility];  Service: ENT;  Laterality: Right;  . DEBRIDEMENT AND CLOSURE WOUND Right 04/03/2017   Procedure: CLOSURE RIGHT ORAL CAVITY WOUND;  Surgeon: Izora Gala, MD;  Location: [facility];  Service: ENT;  Laterality: Right;  . WOUND EXPLORATION Right 04/03/2017   Procedure: RIGHT FACIAL WOUND EXPLORATION;  Surgeon: Izora Gala, MD;  Location: [facility];  Service: ENT;  Laterality: Right;   HPI:  Pt admitted with complete soft tissue injury to right face with likely facial nerve injury. Intubated from 1/8 to 1/10. ENT reports the masseter muscle was completely gone and it appeared that the temporalis tendon was completely transected and a portion was missing. He was unable to reapproximate any of the muscle edges or tendon edge. There is weakness of the entire lower division of the right facial nerve including the buccal branch which was found to be intact surgically. ENT closed intro oral wound and placed drain, which was removed today, day of eval. no salivary drainage from external wound. Pt cleared to being liquid diet.    Assessment / Plan / Recommendation Clinical Impression  Pt demonstrates decreased labial seal due to CN VII weakness on right and severe facial edema. He needs compensatory strategy of placing straw to left corner of mouth to achieve adequate seal on straw for negative pressure for sip. Oral transit and swallow response otherwise adequate. Pt  demonstrates ability to tolerate pills with verbal cues to place between teeth on the left and tilt head slightly posteriorally. Attempted taste of pudding on left. Pt able to accept only small amount orally and needs liquid wash to assist in transit, followed by wet cough. Pt clearly becoming lethargic by end of session. Recommend full liquids, will trial smooth puree and pudding in the future.  SLP Visit Diagnosis: Dysphagia, oral phase (R13.11)    Aspiration Risk  Mild aspiration risk    Diet Recommendation Thin liquid   Liquid Administration via: Straw Medication Administration: Whole meds with liquid Supervision: Patient able to self feed Postural Changes: Seated upright at 90 degrees    Other  Recommendations Oral Care Recommendations: Other (Comment)(oral care per MD)   Follow up Recommendations 24 hour supervision/assistance      Frequency and Duration min 2x/week  1 week       Prognosis Prognosis for Safe Diet Advancement: Good      Swallow Study   General HPI: Pt admitted with complete soft tissue injury to right face with likely facial nerve injury. Intubated from 1/8 to 1/10. ENT reports the masseter muscle was completely gone and it appeared that the temporalis tendon was completely transected and a portion was missing. He was unable to reapproximate any of the muscle edges or tendon edge. There is weakness of the entire lower division of the right facial nerve including the buccal branch which was found to be intact surgically. ENT closed intro oral wound and placed drain, which was removed today, day of eval. no salivary drainage from external wound. Pt cleared to being  liquid diet.  Type of Study: Bedside Swallow Evaluation Previous Swallow Assessment: none Diet Prior to this Study: Thin liquids Temperature Spikes Noted: No Respiratory Status: Room air History of Recent Intubation: Yes Length of Intubations (days): 2 days Date extubated:  04/04/17 Behavior/Cognition: Alert;Cooperative Oral Care Completed by SLP: No Oral Cavity - Dentition: Adequate natural dentition Vision: Functional for self-feeding Self-Feeding Abilities: Able to feed self Patient Positioning: Upright in chair Baseline Vocal Quality: Normal Volitional Cough: Strong Volitional Swallow: Able to elicit    Oral/Motor/Sensory Function Overall Oral Motor/Sensory Function: Severe impairment Facial ROM: Reduced right;Suspected CN VII (facial) dysfunction Facial Symmetry: Abnormal symmetry right;Suspected CN VII (facial) dysfunction Facial Strength: Reduced right;Suspected CN VII (facial) dysfunction Lingual ROM: Within Functional Limits Lingual Symmetry: (unable to visualize. pt cannot protrude tongue) Lingual Strength: Within Functional Limits Lingual Sensation: Within Functional Limits Mandible: Impaired(dectruction of masseter muscle)   Ice Chips     Thin Liquid Thin Liquid: Impaired Oral Phase Impairments: Reduced labial seal Oral Phase Functional Implications: Prolonged oral transit    Nectar Thick Nectar Thick Liquid: Not tested   Honey Thick Honey Thick Liquid: Not tested   Puree Puree: Impaired Presentation: Spoon Oral Phase Impairments: Reduced labial seal;Reduced lingual movement/coordination Oral Phase Functional Implications: Prolonged oral transit;Oral residue Pharyngeal Phase Impairments: Cough - Immediate   Solid   GO   Solid: Not tested       Herbie Baltimore, MA CCC-SLP [phone]  Astor Gentle, Katherene Ponto 04/05/2017,1:30 PM
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
## 2017-04-05 NOTE — Progress Notes (Signed)
Patient ID: Joe Fischer, male   DOB: 09-Jan-1986, 32 y.o.   MRN: [id number] Continues to have significant swelling of the right side of the face but there is no fluctuance and there is no obvious salivary drainage.  The drain was removed.  The intraoral repair appears to be intact.  Start on liquid diet and advance as tolerated.  Continue to observe for salivary drainage from the external wound, and any additional swelling.  Facial nerve function also to be observed over the next few months to see if there is any recovery.

## 2017-04-06 ENCOUNTER — Encounter (HOSPITAL_COMMUNITY): Payer: Self-pay | Admitting: *Deleted

## 2017-04-06 ENCOUNTER — Other Ambulatory Visit: Payer: Self-pay

## 2017-04-06 LAB — CBC
HCT: 23.1 % — ABNORMAL LOW (ref 39.0–52.0)
Hemoglobin: 7.9 g/dL — ABNORMAL LOW (ref 13.0–17.0)
MCH: 31 pg (ref 26.0–34.0)
MCHC: 34.2 g/dL (ref 30.0–36.0)
MCV: 90.6 fL (ref 78.0–100.0)
Platelets: 400 10*3/uL (ref 150–400)
RBC: 2.55 MIL/uL — AB (ref 4.22–5.81)
RDW: 12.5 % (ref 11.5–15.5)
WBC: 6.7 10*3/uL (ref 4.0–10.5)

## 2017-04-06 MED ORDER — WHITE PETROLATUM EX OINT
TOPICAL_OINTMENT | CUTANEOUS | Status: AC
  Administered 2017-04-06: 18:00:00
  Filled 2017-04-06: qty 28.35

## 2017-04-06 MED ORDER — WHITE PETROLATUM GEL
1.0000 | Status: DC | PRN
Start: 2017-04-06 — End: 2017-04-07

## 2017-04-06 MED ORDER — ACETAMINOPHEN 160 MG/5ML PO SOLN
650.0000 mg | Freq: Four times a day (QID) | ORAL | Status: DC | PRN
  Administered 2017-04-06: 650 mg via ORAL
  Filled 2017-04-06: qty 20.3

## 2017-04-06 NOTE — Progress Notes (Signed)
  Speech Language Pathology Treatment: Dysphagia  Patient Details Name: Joe Fischer MRN: [id number] DOB: 18-Sep-1985 Today's Date: 04/06/2017 Time: 8101-7510 SLP Time Calculation (min) (ACUTE ONLY): 13 min  Assessment / Plan / Recommendation Clinical Impression  Pt seen for diet tolerance and for upgraded PO trials of smooth puree. Per RN, pt had some difficulty with straw this morning despite L sided placement. Febrile this morning but lung sounds have been clear and most recent temperature readings WNL. Pt reported he was drowsy but he was able to maintain alertness for entirety of session. Pt now on full liquids, appears to be tolerating well. He self-fed regular liquids via straw, independently placing straw to left to improve labial seal and negative pressure for intake. With repeated challenging with consecutive straw sips, airway protection appears adequate as does timing of swallow initiation. Pt reports he has been better able to reach R side of oral cavity with suction and during oral care. Self fed puree with min cues for placement to L side and for liquid wash to clear. Oral stage is prolonged and pt grimaces slightly during oral prep. Independently suctions after swallow removing what appears to be mild residue mixed with secretions. Vocal quality is clear and no overt signs of aspiration noted. Would continue with full liquids for now for ease of intake, though recommend pt be allowed snacks of purees, smooth puddings from floor stock if desired. Follow with liquid wash and self-suction if needed to clear oral residue. Pt in agreement. Will f/u.    HPI HPI: Pt admitted with complete soft tissue injury to right face with likely facial nerve injury. Intubated from 1/8 to 1/10. ENT reports the masseter muscle was completely gone and it appeared that the temporalis tendon was completely transected and a portion was missing. He was unable to reapproximate any of the muscle edges or tendon  edge. There is weakness of the entire lower division of the right facial nerve including the buccal branch which was found to be intact surgically. ENT closed intro oral wound and placed drain, which was removed today, day of eval. no salivary drainage from external wound. Pt cleared to being liquid diet.       SLP Plan  Continue with current plan of care       Recommendations  Diet recommendations: Thin liquid Liquids provided via: Straw Medication Administration: Whole meds with liquid Supervision: Patient able to self feed Compensations: Slow rate;Small sips/bites                Oral Care Recommendations: Other (Comment)(oral care per md) Follow up Recommendations: 24 hour supervision/assistance SLP Visit Diagnosis: Dysphagia, oral phase (R13.11) Plan: Continue with current plan of care       [place], [place], [place] Pathologist [phone]  Aliene Altes 04/06/2017, 3:01 PM

## 2017-04-06 NOTE — Progress Notes (Signed)
PT. Has a temp of 101.9. Nurse called Trauma. Dr. Amado Coe said to administer Tylenol and recheck if it does not go down order cultures and chest x-ray. Explained to MD about foul odor from PT's mouth MD said they will be here to assess PT in the AM. Will cont. To monitor.
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
## 2017-04-07 LAB — CBC
HEMATOCRIT: 24.7 % — AB (ref 39.0–52.0)
Hemoglobin: 8.3 g/dL — ABNORMAL LOW (ref 13.0–17.0)
MCH: 30.5 pg (ref 26.0–34.0)
MCHC: 33.6 g/dL (ref 30.0–36.0)
MCV: 90.8 fL (ref 78.0–100.0)
Platelets: 498 10*3/uL — ABNORMAL HIGH (ref 150–400)
RBC: 2.72 MIL/uL — ABNORMAL LOW (ref 4.22–5.81)
RDW: 12.6 % (ref 11.5–15.5)
WBC: 7.3 10*3/uL (ref 4.0–10.5)

## 2017-04-07 MED ORDER — CHLORHEXIDINE GLUCONATE 0.12% ORAL RINSE (MEDLINE KIT): 15.0000 mL | Freq: Two times a day (BID) | OROMUCOSAL | 0 refills | Status: AC

## 2017-04-07 MED ORDER — HYDROCODONE-ACETAMINOPHEN 7.5-325 MG/15ML PO SOLN: 10.0000 mL | ORAL | 0 refills | Status: AC | PRN

## 2017-04-07 NOTE — Progress Notes (Signed)
NURSING PROGRESS NOTE  [place] Charland [id number] Discharge Data: 04/07/2017 2:22 PM Attending Provider: No att. providers found VUD:[email or web address], No Pcp Per     [name] to be D/C'd Home with sister per MD order.  Discussed with the patient the After Visit Summary and all questions fully answered. All IV's discontinued with no bleeding noted. All belongings returned to patient for patient to take home. Patient sent home with printed prescriptions for hycet and chlorhexidine mouth solution.  Last Vital Signs:  Blood pressure (!) 141/83, pulse 66, temperature 98.5 F (36.9 C), resp. rate 17, height 6' 1"  (1.854 m), weight 69.2 kg (152 lb 8.9 oz), SpO2 97 %.  Discharge Medication List Allergies as of 04/07/2017   No Known Allergies     Medication List    TAKE these medications   chlorhexidine gluconate (MEDLINE KIT) 0.12 % solution Commonly known as:  PERIDEX 15 mLs by Mouth Rinse route 2 (two) times daily.   HYDROcodone-acetaminophen 7.5-325 mg/15 ml solution Commonly known as:  HYCET Take 10 mLs by mouth every 4 (four) hours as needed for moderate pain.            Discharge Care Instructions  (From admission, onward)        Start     Ordered   04/07/17 0000  Discharge wound care:    Comments:  Keep dry dressing over incision and change daily and as needed. Apply antibiotic ointment to the incision daily.  Continue with oral hygiene as you have been doing while in the hospital   04/07/17 [phone]
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
## 2017-04-08 ENCOUNTER — Emergency Department (HOSPITAL_COMMUNITY): Payer: Self-pay

## 2017-04-08 ENCOUNTER — Emergency Department (HOSPITAL_COMMUNITY)
Admission: EM | Admit: 2017-04-08 | Discharge: 2017-04-08 | Disposition: A | Discharge status: Home / Self Care | Payer: Self-pay | Attending: Emergency Medicine | Admitting: Emergency Medicine

## 2017-04-08 ENCOUNTER — Other Ambulatory Visit: Payer: Self-pay

## 2017-04-08 ENCOUNTER — Encounter (HOSPITAL_COMMUNITY): Payer: Self-pay | Admitting: Emergency Medicine

## 2017-04-08 DIAGNOSIS — D473 Essential (hemorrhagic) thrombocythemia: Secondary | ICD-10-CM

## 2017-04-08 DIAGNOSIS — Z79899 Other long term (current) drug therapy: Secondary | ICD-10-CM | POA: Insufficient documentation

## 2017-04-08 DIAGNOSIS — S0180XD Unspecified open wound of other part of head, subsequent encounter: Secondary | ICD-10-CM | POA: Insufficient documentation

## 2017-04-08 DIAGNOSIS — D649 Anemia, unspecified: Secondary | ICD-10-CM

## 2017-04-08 DIAGNOSIS — R51 Headache: Secondary | ICD-10-CM | POA: Insufficient documentation

## 2017-04-08 DIAGNOSIS — S0191XD Laceration without foreign body of unspecified part of head, subsequent encounter: Secondary | ICD-10-CM

## 2017-04-08 DIAGNOSIS — Y939 Activity, unspecified: Secondary | ICD-10-CM | POA: Insufficient documentation

## 2017-04-08 DIAGNOSIS — Y999 Unspecified external cause status: Secondary | ICD-10-CM | POA: Insufficient documentation

## 2017-04-08 DIAGNOSIS — D75839 Thrombocytosis, unspecified: Secondary | ICD-10-CM

## 2017-04-08 DIAGNOSIS — W3400XD Accidental discharge from unspecified firearms or gun, subsequent encounter: Secondary | ICD-10-CM | POA: Insufficient documentation

## 2017-04-08 LAB — CBC
HCT: 25.4 % — ABNORMAL LOW (ref 39.0–52.0)
Hemoglobin: 8.5 g/dL — ABNORMAL LOW (ref 13.0–17.0)
MCH: 30.5 pg (ref 26.0–34.0)
MCHC: 33.5 g/dL (ref 30.0–36.0)
MCV: 91 fL (ref 78.0–100.0)
PLATELETS: 589 10*3/uL — AB (ref 150–400)
RBC: 2.79 MIL/uL — ABNORMAL LOW (ref 4.22–5.81)
RDW: 12.7 % (ref 11.5–15.5)
WBC: 7.9 10*3/uL (ref 4.0–10.5)

## 2017-04-08 LAB — BASIC METABOLIC PANEL
ANION GAP: 12 (ref 5–15)
BUN: 8 mg/dL (ref 6–20)
CO2: 23 mmol/L (ref 22–32)
Calcium: 8.7 mg/dL — ABNORMAL LOW (ref 8.9–10.3)
Chloride: 102 mmol/L (ref 101–111)
Creatinine, Ser: 0.63 mg/dL (ref 0.61–1.24)
GLUCOSE: 95 mg/dL (ref 65–99)
POTASSIUM: 3.3 mmol/L — AB (ref 3.5–5.1)
Sodium: 137 mmol/L (ref 135–145)

## 2017-04-08 MED ORDER — IOPAMIDOL (ISOVUE-300) INJECTION 61%
INTRAVENOUS | Status: AC
  Administered 2017-04-08: 75 mL
  Filled 2017-04-08: qty 75

## 2017-04-08 MED ORDER — CHLORHEXIDINE GLUCONATE 0.12 % MT SOLN
15.0000 mL | Freq: Once | OROMUCOSAL | Status: AC
  Administered 2017-04-08: 15 mL via OROMUCOSAL
  Filled 2017-04-08: qty 15
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
## 2017-04-08 NOTE — ED Notes (Signed)
Pt in CT at this time.

## 2017-04-08 NOTE — ED Notes (Signed)
Ok per Dr. Roxanne Mins for patient's sister to give pt his own Hycet liquid

## 2017-04-08 NOTE — ED Notes (Signed)
Dr. Glick at bedside.  

## 2017-04-08 NOTE — ED Notes (Signed)
Pt belligerent regarding IV start. Stating "you need to get someone in here who knows what the fuck they are doing" and attempts made to reassure patient of my years of experience, pt upset regarding recent hospitalization and number of IV and blood sticks he required.

## 2017-10-22 ENCOUNTER — Other Ambulatory Visit: Payer: Self-pay

## 2017-10-22 ENCOUNTER — Emergency Department
Admission: EM | Admit: 2017-10-22 | Discharge: 2017-10-22 | Disposition: A | Discharge status: Home / Self Care | Payer: Self-pay | Attending: Emergency Medicine | Admitting: Emergency Medicine

## 2017-10-22 DIAGNOSIS — F32A Depression, unspecified: Secondary | ICD-10-CM

## 2017-10-22 DIAGNOSIS — F419 Anxiety disorder, unspecified: Secondary | ICD-10-CM | POA: Insufficient documentation

## 2017-10-22 DIAGNOSIS — F329 Major depressive disorder, single episode, unspecified: Secondary | ICD-10-CM | POA: Insufficient documentation

## 2017-10-22 NOTE — ED Notes (Signed)
ED Provider at bedside. 

## 2017-10-22 NOTE — ED Notes (Signed)
Pt ambulating in the hallway from the interview room back to 19H  - pt arrived to the bed - picked up the blanket and sandwich tray  - turned and walked away  - he stated  "Peace out"  Attempted to get him to return for discharge instructions  He did not respond to my attempts
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
## 2017-10-22 NOTE — ED Notes (Signed)
Pt eloped.

## 2017-10-22 NOTE — ED Notes (Signed)
SOC in progress.  

## 2017-10-22 NOTE — ED Triage Notes (Signed)
Pt arrives to ED via POV from home with c/o anxiety. Pt reports "lower back pain", and feeling "pain" when he gets "stressed out'. Pt denies any c/o SI or HI; pt states if he "keeps things bottled up I might have a stroke or something". Pt is A&O, in NAD; RR even, regular, and unlabored. Pt denies use of drugs or ETOH PTA.

## 2018-10-26 IMAGING — DX DG ABD PORTABLE 1V
1 series · 1 of 1 positions shown · non-contrast
Comparison: None.

CLINICAL DATA: OG tube placement.

EXAM:
PORTABLE ABDOMEN - 1 VIEW

[abdomen kub]
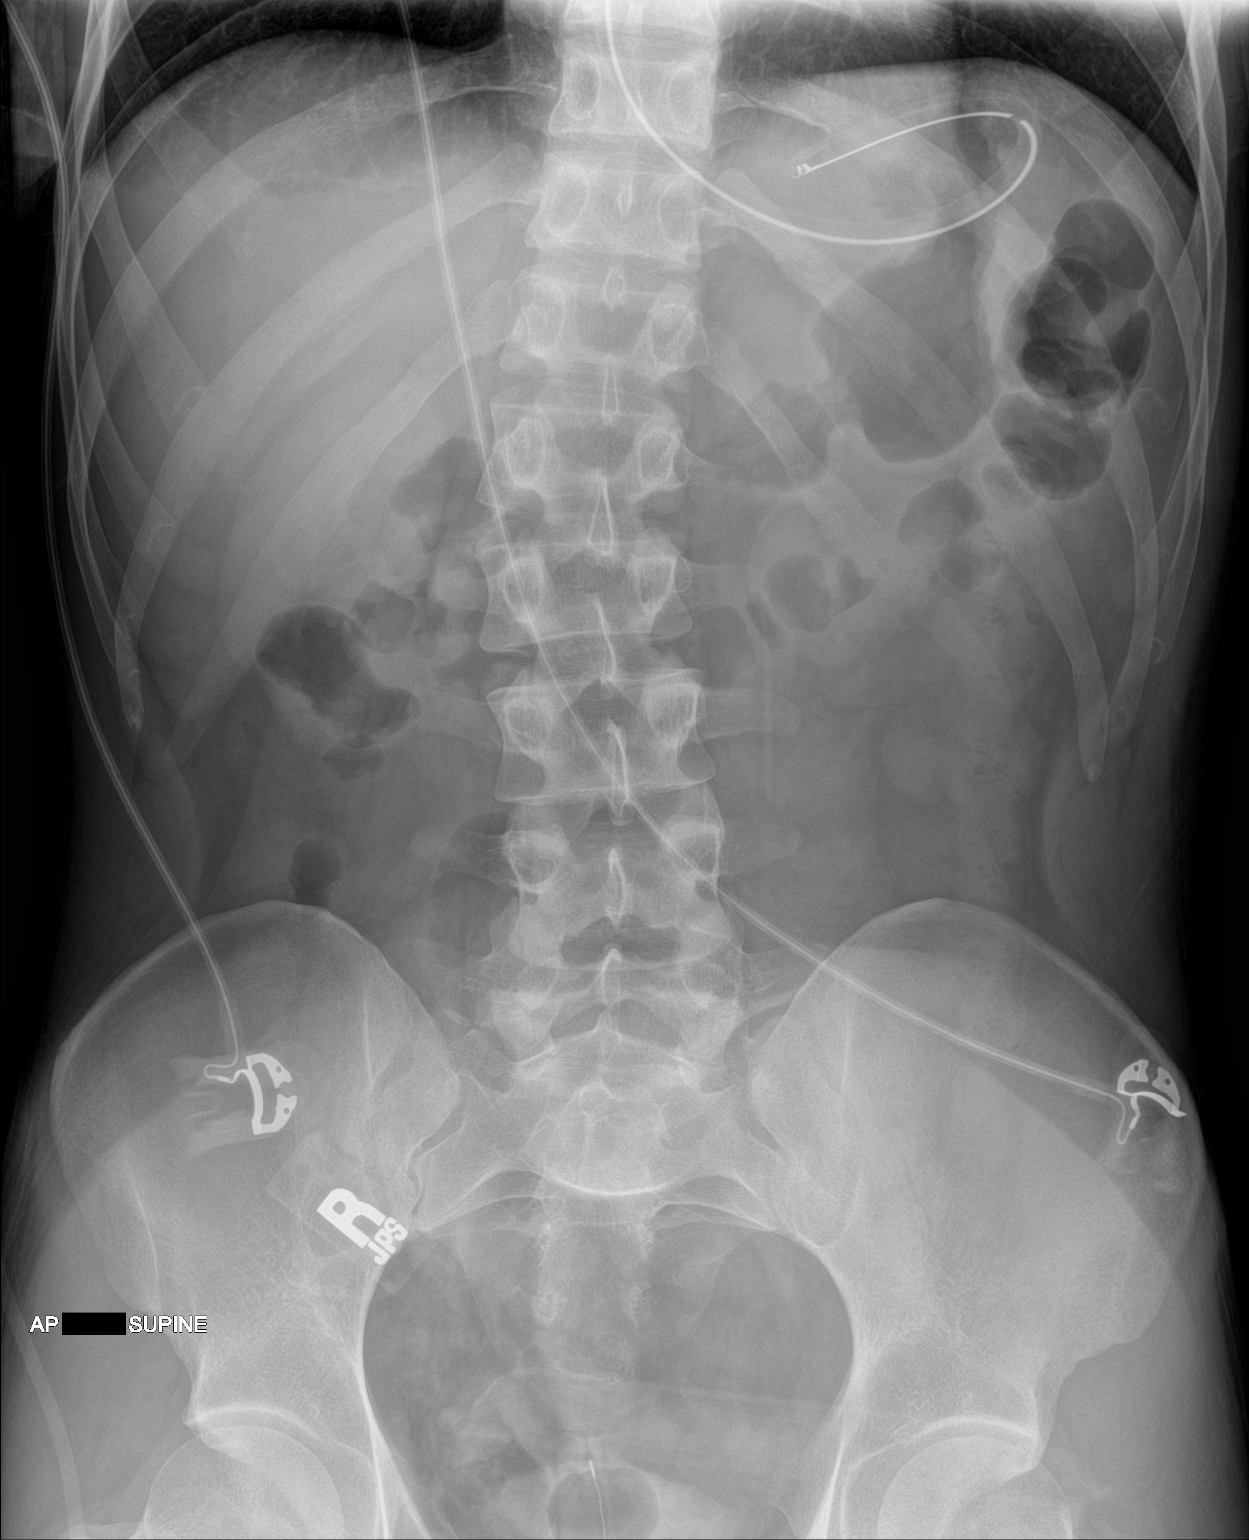

[1 of 1 positions shown; findings below may reference images not displayed]

FINDINGS: Tip and side port of the enteric tube below the diaphragm in the
stomach. Normal bowel gas pattern. No free air. Probable Foley
catheter in the pelvis. No acute osseous abnormality.
IMPRESSION: Tip and side port of the enteric tube below the diaphragm in the
stomach.

## 2021-10-22 ENCOUNTER — Emergency Department: Payer: Self-pay

## 2021-10-22 ENCOUNTER — Emergency Department
Admission: EM | Admit: 2021-10-22 | Discharge: 2021-10-22 | Disposition: A | Discharge status: Home / Self Care | Payer: Self-pay | Attending: Emergency Medicine | Admitting: Emergency Medicine

## 2021-10-22 ENCOUNTER — Encounter: Payer: Self-pay | Admitting: Emergency Medicine

## 2021-10-22 ENCOUNTER — Other Ambulatory Visit: Payer: Self-pay

## 2021-10-22 DIAGNOSIS — U071 COVID-19: Secondary | ICD-10-CM | POA: Insufficient documentation

## 2021-10-22 DIAGNOSIS — R9389 Abnormal findings on diagnostic imaging of other specified body structures: Secondary | ICD-10-CM

## 2021-10-22 DIAGNOSIS — R0781 Pleurodynia: Secondary | ICD-10-CM | POA: Insufficient documentation

## 2021-10-22 LAB — CBC WITH DIFFERENTIAL/PLATELET
Abs Immature Granulocytes: 0.01 10*3/uL (ref 0.00–0.07)
Basophils Absolute: 0 10*3/uL (ref 0.0–0.1)
Basophils Relative: 0 %
Eosinophils Absolute: 0 10*3/uL (ref 0.0–0.5)
Eosinophils Relative: 0 %
HCT: 38.2 % — ABNORMAL LOW (ref 39.0–52.0)
Hemoglobin: 13 g/dL (ref 13.0–17.0)
Immature Granulocytes: 0 %
Lymphocytes Relative: 7 %
Lymphs Abs: 0.4 10*3/uL — ABNORMAL LOW (ref 0.7–4.0)
MCH: 30.4 pg (ref 26.0–34.0)
MCHC: 34 g/dL (ref 30.0–36.0)
MCV: 89.5 fL (ref 80.0–100.0)
Monocytes Absolute: 0.4 10*3/uL (ref 0.1–1.0)
Monocytes Relative: 8 %
Neutro Abs: 4.6 10*3/uL (ref 1.7–7.7)
Neutrophils Relative %: 85 %
Platelets: 205 10*3/uL (ref 150–400)
RBC: 4.27 MIL/uL (ref 4.22–5.81)
RDW: 12.3 % (ref 11.5–15.5)
WBC: 5.5 10*3/uL (ref 4.0–10.5)
nRBC: 0 % (ref 0.0–0.2)

## 2021-10-22 LAB — RESP PANEL BY RT-PCR (FLU A&B, COVID) ARPGX2
Influenza A by PCR: NEGATIVE
Influenza B by PCR: NEGATIVE
SARS Coronavirus 2 by RT PCR: POSITIVE — AB

## 2021-10-22 LAB — BASIC METABOLIC PANEL
Anion gap: 7 (ref 5–15)
BUN: 10 mg/dL (ref 6–20)
CO2: 24 mmol/L (ref 22–32)
Calcium: 8.6 mg/dL — ABNORMAL LOW (ref 8.9–10.3)
Chloride: 106 mmol/L (ref 98–111)
Creatinine, Ser: 0.87 mg/dL (ref 0.61–1.24)
GFR, Estimated: >60 mL/min (ref >60)
Glucose, Bld: 109 mg/dL — ABNORMAL HIGH (ref 70–99)
Potassium: 3.8 mmol/L (ref 3.5–5.1)
Sodium: 137 mmol/L (ref 135–145)

## 2021-10-22 LAB — SARS CORONAVIRUS 2 BY RT PCR: SARS Coronavirus 2 by RT PCR: POSITIVE — AB

## 2021-10-22 LAB — D-DIMER, QUANTITATIVE: D-Dimer, Quant: 0.56 ug/mL-FEU — ABNORMAL HIGH (ref 0.00–0.50)

## 2021-10-22 MED ORDER — GUAIFENESIN-CODEINE 100-10 MG/5ML PO SYRP: 10.0000 mL | ORAL_SOLUTION | Freq: Three times a day (TID) | ORAL | 0 refills | Status: AC | PRN

## 2021-10-22 MED ORDER — IOHEXOL 350 MG/ML SOLN
75.0000 mL | Freq: Once | INTRAVENOUS | Status: AC | PRN
  Administered 2021-10-22: 75 mL via INTRAVENOUS

## 2021-10-22 MED ORDER — NAPROXEN 500 MG PO TABS: 500.0000 mg | ORAL_TABLET | Freq: Two times a day (BID) | ORAL | 0 refills | Status: AC

## 2021-10-22 MED ORDER — DEXAMETHASONE SODIUM PHOSPHATE 10 MG/ML IJ SOLN
10.0000 mg | Freq: Once | INTRAMUSCULAR | Status: AC
  Administered 2021-10-22: 10 mg via INTRAVENOUS
  Filled 2021-10-22: qty 1

## 2021-10-22 MED ORDER — ACETAMINOPHEN 325 MG PO TABS: 650.0000 mg | ORAL_TABLET | Freq: Once | ORAL | Status: AC

## 2021-10-22 MED ORDER — ACETAMINOPHEN 325 MG PO TABS
ORAL_TABLET | ORAL | Status: AC
  Administered 2021-10-22: 650 mg via ORAL
  Filled 2021-10-22: qty 2
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
## 2021-10-22 NOTE — ED Triage Notes (Signed)
Pt via POV from home. Pt c/o cough, chills, and fever that yesterday morning. States he also thinks he may have pulled a muscle on his R side. States he felt like he had a fever this AM but didn't actually check it. Pt is A&OX4 and NAD

## 2021-10-22 NOTE — ED Provider Notes (Signed)
[facility]  Provider Note    Event Date/Time   First MD Initiated Contact with Patient 10/22/21 [phone]     (approximate)   History   Rib Injury and Cough   HPI  Joe Fischer is a 36 y.o. male presents to the emergency department for treatment and evaluation of left side back pain when taking a deep breath.  He also has an intermittent cough, chills, and fever.  Symptoms started yesterday morning.  Patient vapes but has no history of PE/DVT.  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  History reviewed. No pertinent past medical history.   Physical Exam   Triage Vital Signs: ED Triage Vitals  Enc Vitals Group     BP 10/22/21 0825 118/71     Pulse Rate 10/22/21 0825 96     Resp 10/22/21 0825 20     Temp 10/22/21 0825 100.3 F (37.9 C)     Temp Source 10/22/21 0825 Oral     SpO2 10/22/21 0825 100 %     Weight 10/22/21 0823 155 lb (70.3 kg)     Height 10/22/21 0823 '6\' 2"'$  (1.88 m)     Head Circumference --      Peak Flow --      Pain Score 10/22/21 0823 6     Pain Loc --      Pain Edu? --      Excl. in [place]? --     Most recent vital signs: Vitals:   10/22/21 1126 10/22/21 1130  BP: (!) 98/53 (!) 97/54  Pulse: 81 81  Resp: 16   Temp: 98.8 F (37.1 C)   SpO2: 100% 100%    General: Awake, no distress.  CV:  Good peripheral perfusion.  Resp:  Normal effort.  Breath sounds are clear throughout Abd:  No distention.  Other:  Focal tenderness over the mid left side back   ED Results / Procedures / Treatments   Labs (all labs ordered are listed, but only abnormal results are displayed) Labs Reviewed  SARS CORONAVIRUS 2 BY RT PCR - Abnormal; Notable for the following components:      Result Value   SARS Coronavirus 2 by RT PCR POSITIVE (*)    All other components within normal limits  RESP PANEL BY RT-PCR (FLU A&B, COVID) ARPGX2 - Abnormal; Notable for the following  components:   SARS Coronavirus 2 by RT PCR POSITIVE (*)    All other components within normal limits  CBC WITH DIFFERENTIAL/PLATELET - Abnormal; Notable for the following components:   HCT 38.2 (*)    Lymphs Abs 0.4 (*)    All other components within normal limits  BASIC METABOLIC PANEL - Abnormal; Notable for the following components:   Glucose, Bld 109 (*)    Calcium 8.6 (*)    All other components within normal limits  D-DIMER, QUANTITATIVE - Abnormal; Notable for the following components:   D-Dimer, Quant 0.56 (*)    All other components within normal limits    EKG   RADIOLOGY  Chest x-ray negative for acute cardiopulmonary abnormality.  I have independently reviewed and interpreted imaging as well as reviewed report from radiology.  PROCEDURES:  Critical Care performed: No  Procedures   MEDICATIONS ORDERED IN ED:  Medications  acetaminophen (TYLENOL) tablet 650 mg (650 mg Oral Given 10/22/21 0841)  dexamethasone (DECADRON) injection 10 mg (10 mg Intravenous Given  10/22/21 1118)  iohexol (OMNIPAQUE) 350 MG/ML injection 75 mL (75 mLs Intravenous Contrast Given 10/22/21 1106)     IMPRESSION / MDM / ASSESSMENT AND PLAN / ED COURSE   I reviewed the triage vital signs and the nursing notes.  Differential diagnosis includes, but is not limited to: COVID, influenza, PE, muscle strain  Patient's presentation is most consistent with acute illness / injury with system symptoms.  36 year old male presenting to the emergency department for treatment and evaluation of pleuritic pain with fever, chills, intermittent cough that started yesterday.  See HPI for further details.  Lab studies show a slight bump in the D-dimer at 0.56.  CBC is unremarkable.  BMP is also unremarkable.  COVID test is positive.  Because patient has pleuritic pain and a mild bump in the D-dimer, plan will be to get a CT for PE.  Patient aware and agreeable to the plan.  CT is negative for PE but does  show an incidental finding of soft tissue in the anterior mediastinum which is likely represents residual thymic tissue.  There is no lymphadenopathy noted in the mediastinal or hilar nodal stations.  Unable to completely rule out thymoma.   Patient will be discharged home with symptomatic treatment of fever and other COVID symptoms.  Quarantine instructions provided.  He was also made aware of incidental findings on CT scan and encouraged to call and schedule an appointment with primary care.     FINAL CLINICAL IMPRESSION(S) / ED DIAGNOSES   Final diagnoses:  COVID  Abnormal finding on radiology exam     Rx / DC Orders   ED Discharge Orders          Ordered    naproxen (NAPROSYN) 500 MG tablet  2 times daily with meals        10/22/21 1159    guaiFENesin-codeine (ROBITUSSIN AC) 100-10 MG/5ML syrup  3 times daily PRN        10/22/21 1159             Note:  This document was prepared using Dragon voice recognition software and may include unintentional dictation errors.   Victorino Dike, FNP 10/22/21 1435    Delman Kitten, MD 10/22/21 [phone]

## 2021-10-22 NOTE — Discharge Instructions (Addendum)
Your CT was negative for PE. It does show an incidental finding of soft tissue that is usually not there as an adult. This is likely nothing to be concerned about, however you will need to follow up with primary care who can investigate this further.  Return to the ER for symptoms that change, worsen, or for new concerns.
